# Patient Record
Sex: Female | Born: 1990 | Race: Black or African American | Hispanic: No | Marital: Single | Smoking: Never smoker
Health system: Southern US, Community
[De-identification: ages and names within clinical notes are randomized; demographics above are authoritative.]

---

## 2017-05-04 ENCOUNTER — Ambulatory Visit (INDEPENDENT_AMBULATORY_CARE_PROVIDER_SITE_OTHER): Payer: Commercial Managed Care - PPO | Admitting: Maternal Newborn

## 2017-05-04 ENCOUNTER — Encounter: Payer: Self-pay | Admitting: Maternal Newborn

## 2017-05-04 VITALS — BP 134/100 | HR 84 | Ht 63.0 in | Wt 235.0 lb

## 2017-05-04 DIAGNOSIS — Z3009 Encounter for other general counseling and advice on contraception: Secondary | ICD-10-CM

## 2017-05-04 DIAGNOSIS — Z124 Encounter for screening for malignant neoplasm of cervix: Secondary | ICD-10-CM

## 2017-05-04 DIAGNOSIS — Z30011 Encounter for initial prescription of contraceptive pills: Secondary | ICD-10-CM

## 2017-05-04 DIAGNOSIS — Z01419 Encounter for gynecological examination (general) (routine) without abnormal findings: Secondary | ICD-10-CM

## 2017-05-04 MED ORDER — NORETHINDRONE 0.35 MG PO TABS: 1.0000 | ORAL_TABLET | Freq: Every day | ORAL | 11 refills | Status: AC

## 2017-05-04 NOTE — Progress Notes (Signed)
Gynecology Annual Exam  PCP: Patient, No Pcp Per  Chief Complaint:  Chief Complaint  Patient presents with  . Gynecologic Exam    bc refill    History of Present Illness: Patient is a 27 y.o. No obstetric history on file, presenting for annual exam. The patient has no complaints today.   LMP: Patient's last menstrual period was 02/03/2017. Menarche: 15 Average Interval: regular, 28 days Duration of flow: several days Heavy Menses: no Clots: no Intermenstrual Bleeding: no Postcoital Bleeding: no Dysmenorrhea: no  The patient is sexually active. She currently uses OCP (estrogen/progesterone) for contraception. She denies dyspareunia.  The patient does perform self breast exams.  There is no notable family history of breast or ovarian cancer in her family.  The patient wears seatbelts: yes.  The patient has regular exercise: no.    The patient denies current symptoms of depression.    Review of Systems  Constitutional: Negative for malaise/fatigue and weight loss.  HENT: Negative.   Eyes: Negative for blurred vision and pain.  Respiratory: Negative for cough and wheezing.   Cardiovascular: Negative for chest pain and palpitations.  Gastrointestinal: Negative for constipation, diarrhea, heartburn and nausea.  Genitourinary: Negative.   Musculoskeletal: Negative.   Skin: Negative.   Neurological: Negative.   Endo/Heme/Allergies: Negative.   Psychiatric/Behavioral: Negative for depression. The patient is not nervous/anxious.   All other systems reviewed and are negative.   Past Medical History:  History reviewed. No pertinent past medical history.  Past Surgical History:  History reviewed. No pertinent surgical history.  Gynecologic History:  Patient's last menstrual period was 02/03/2017. Contraception: OCP (estrogen/progesterone) Last Pap: has had a normal Pap but date unknown  Obstetric History: No obstetric history on file.  Family History:  Family  History  Problem Relation Age of Onset  . Diabetes Father   . Hypertension Father   . Cancer Maternal Aunt 40       colon  . Cancer Maternal Grandfather        colon  . Diabetes Paternal Grandmother   . Diabetes Paternal Grandfather   . Hypertension Paternal Grandfather   . Heart failure Maternal Aunt     Social History:  Social History   Socioeconomic History  . Marital status: Single    Spouse name: Not on file  . Number of children: Not on file  . Years of education: Not on file  . Highest education level: Not on file  Social Needs  . Financial resource strain: Not on file  . Food insecurity - worry: Not on file  . Food insecurity - inability: Not on file  . Transportation needs - medical: Not on file  . Transportation needs - non-medical: Not on file  Occupational History  . Not on file  Tobacco Use  . Smoking status: Never Smoker  . Smokeless tobacco: Never Used  Substance and Sexual Activity  . Alcohol use: Yes    Frequency: Never    Comment: occ  . Drug use: No  . Sexual activity: Yes    Birth control/protection: Condom  Other Topics Concern  . Not on file  Social History Narrative  . Not on file    Allergies:  No Known Allergies  Medications: Prior to Admission medications   Medication Sig Start Date End Date Taking? Authorizing Provider  norgestimate-ethinyl estradiol (SPRINTEC 28) 0.25-35 MG-MCG tablet Take 1 tablet by mouth daily.   Yes [provider]    Physical Exam Vitals: Blood pressure (!) 150/94,  pulse 84, height 5\' 3"  (1.6 m), weight 235 lb (106.6 kg), last menstrual period 02/03/2017.  General: NAD HEENT: normocephalic, anicteric Thyroid: no enlargement, no palpable nodules Pulmonary: No increased work of breathing, CTAB Cardiovascular: RRR, S1 and S2 auscultated, no murmurs, rubs or gallops Breast: Breasts symmetrical, no tenderness, no palpable nodules or masses, no skin or nipple retraction present, no nipple discharge.   No axillary or supraclavicular lymphadenopathy. Abdomen: soft, non-tender, non-distended.  Umbilicus without lesions.  No hepatomegaly, splenomegaly or masses palpable. No evidence of hernia  Genitourinary:  External: Normal external female genitalia.  Normal urethral  meatus, normal Bartholin's and Skene's glands.    Vagina: Normal vaginal mucosa, no evidence of prolapse.    Cervix: Grossly normal in appearance, no bleeding  Uterus: Non-enlarged, mobile, normal contour.  No CMT  Adnexa: ovaries non-enlarged, no adnexal masses  Rectal: deferred  Lymphatic: no evidence of inguinal lymphadenopathy Extremities: no edema, erythema, or tenderness Neurologic: Grossly intact Psychiatric: mood appropriate, affect full  Assessment: 27 y.o. No obstetric history on file, routine annual exam.  Plan: Problem List Items Addressed This Visit    None    Visit Diagnoses    Encounter for annual routine gynecological examination    -  Primary   Pap smear for cervical cancer screening       Relevant Orders   Pap IG w/ reflex to HPV when ASC-U   Oral contraceptive prescribed       Relevant Medications   norethindrone (MICRONOR,CAMILA,ERRIN) 0.35 MG tablet   Encounter for counseling regarding initiation of other contraceptive measure          1) Repeat BP in clinic 134/100. Patient is asymptomatic. Encouraged her to screen for elevated blood pressure over the next few weeks at home or at a pharmacy and seek care from a PCP for antihypertensive therapy if readings remain high after stopping OCPs.  2) STI screening was offered and declined.  3) ASCCP guidelines and rationale discussed.  Patient opts for every 3 year screening interval.  4) Contraception - Discontinuing combination OCPs today due to two elevated BP readings at this visit, requiring cessation according to the [facility] guidelines for contraceptive therapy. Education given regarding options for contraception, including injectable contraception,  IUD placement, Nexplanon.  Patient does not desire a LARC or DepoProvera at this time. She will try Micronor. Explained side effects and emphasized need for heightened level of accuracy with POPs and to take them at the same time daily.  5) Follow up 1 year for routine annual exam.  Marcelyn Bruins, CNM 05/04/2017  11:58 AM

## 2017-05-07 LAB — PAP IG W/ RFLX HPV ASCU: PAP Smear Comment: 0

## 2017-05-25 ENCOUNTER — Encounter: Payer: Self-pay | Admitting: Maternal Newborn

## 2021-04-28 ENCOUNTER — Other Ambulatory Visit: Payer: Self-pay | Admitting: Certified Nurse Midwife

## 2021-04-28 DIAGNOSIS — O200 Threatened abortion: Secondary | ICD-10-CM

## 2021-04-29 ENCOUNTER — Ambulatory Visit: Payer: BC Managed Care – PPO

## 2021-04-29 ENCOUNTER — Ambulatory Visit: Payer: Self-pay

## 2021-04-30 ENCOUNTER — Ambulatory Visit
Admission: RE | Admit: 2021-04-30 | Discharge: 2021-04-30 | Disposition: A | Discharge status: Home / Self Care | Payer: BC Managed Care – PPO | Source: Ambulatory Visit | Attending: Certified Nurse Midwife | Admitting: Certified Nurse Midwife

## 2021-04-30 DIAGNOSIS — O200 Threatened abortion: Secondary | ICD-10-CM | POA: Insufficient documentation

## 2023-02-18 IMAGING — US US OB < 14 WEEKS - US OB TV
1 series · 15 of 28 positions shown · non-contrast
Comparison: None.

CLINICAL DATA: Abortion

EXAM:
OBSTETRIC <14 WK US AND TRANSVAGINAL OB US
TECHNIQUE: Both transabdominal and transvaginal ultrasound examinations were
performed for complete evaluation of the gestation as well as the
maternal uterus, adnexal regions, and pelvic cul-de-sac.
Transvaginal technique was performed to assess early pregnancy.

[Series 1: us ob comp less 14 wks · 15 of 119 slices shown]
[im 1/119]
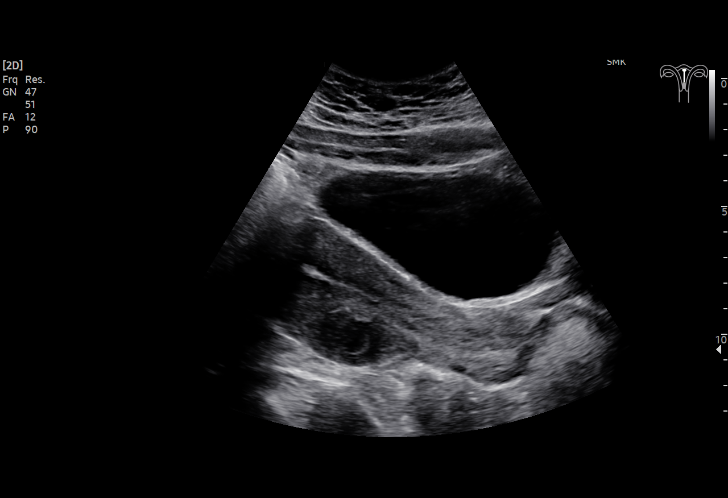
[im 9/119]
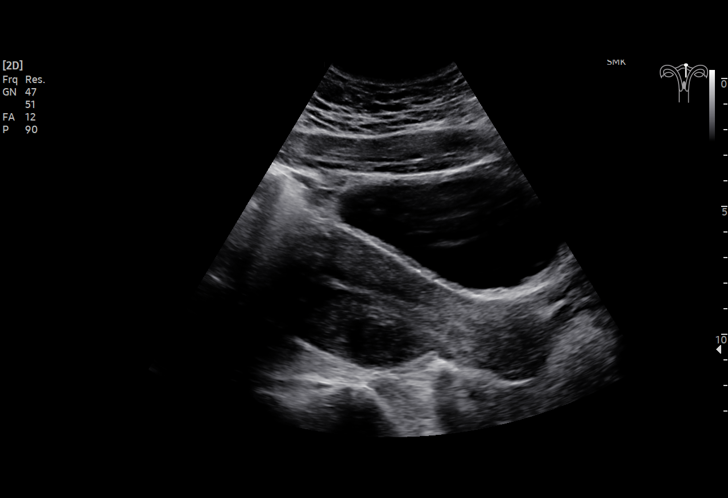
[im 18/119]
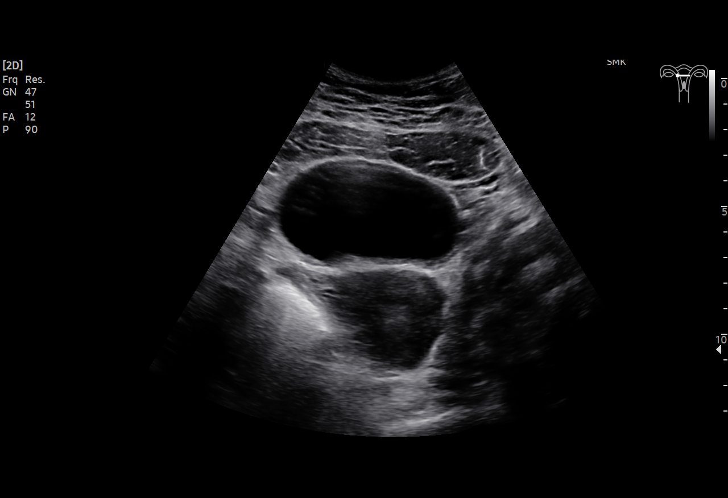
[im 27/119]
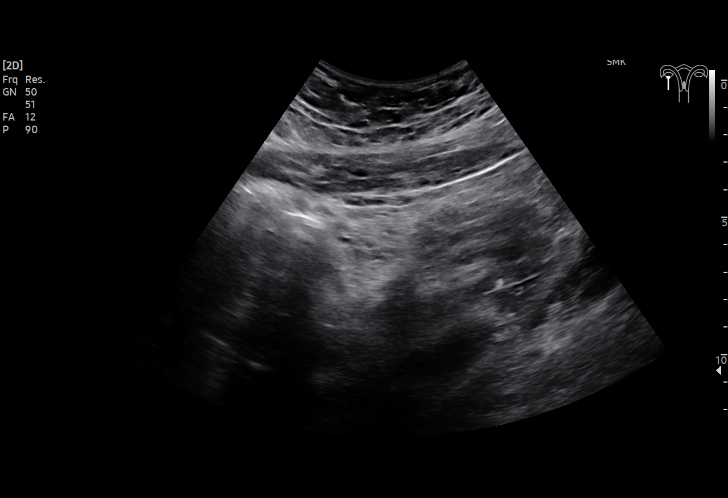
[im 35/119]
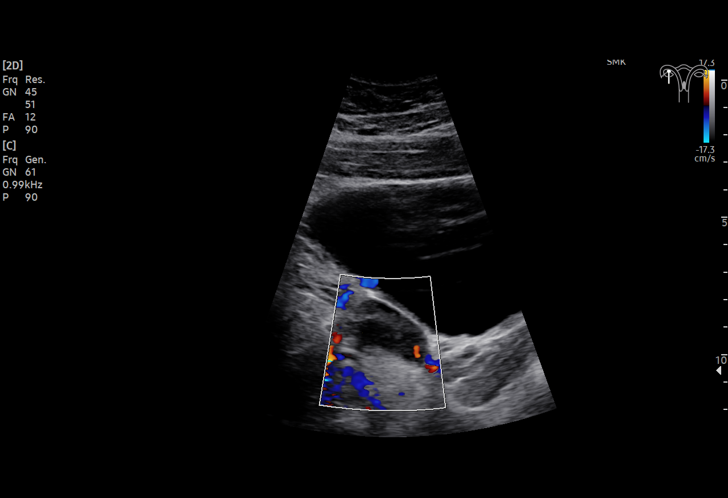
[im 44/119]
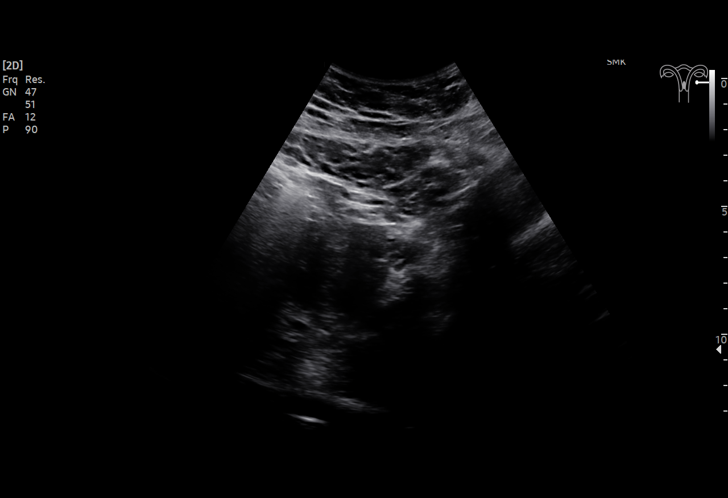
[im 53/119]
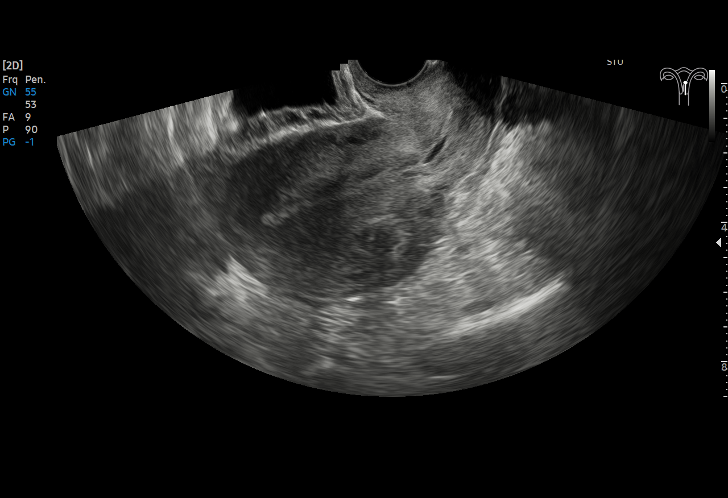
[im 62/119]
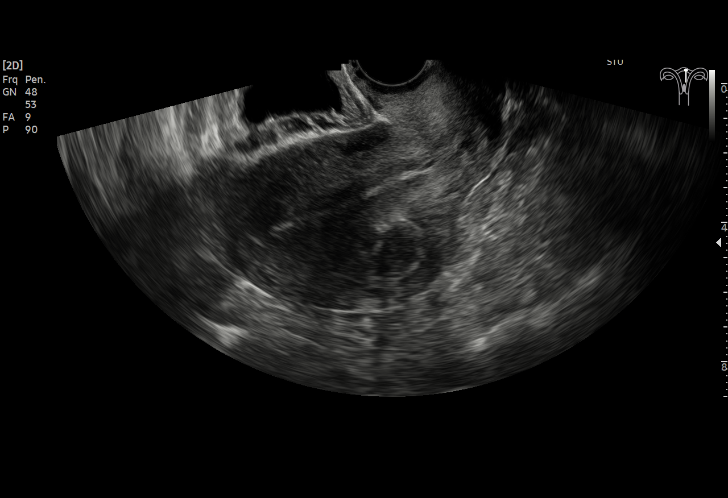
[im 66/119]
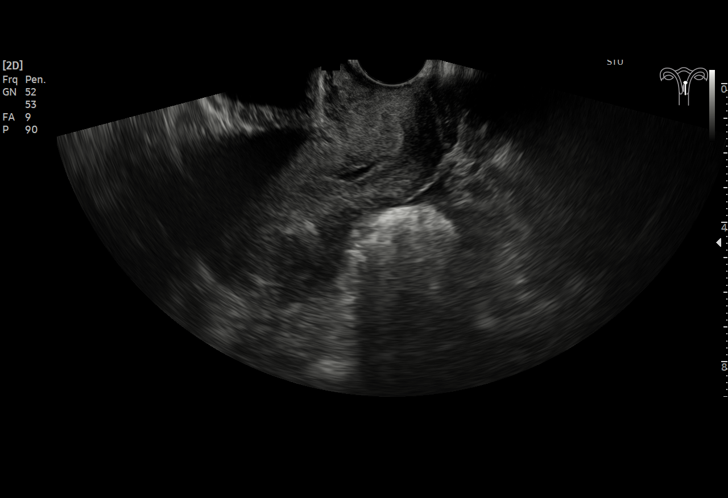
[im 75/119]
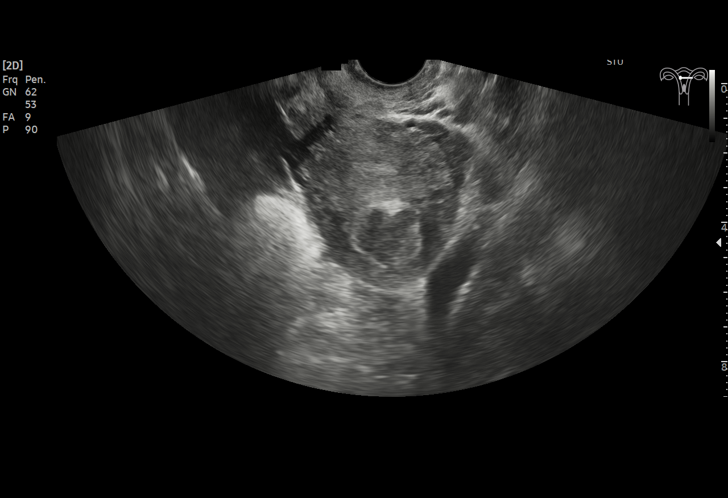
[im 84/119]
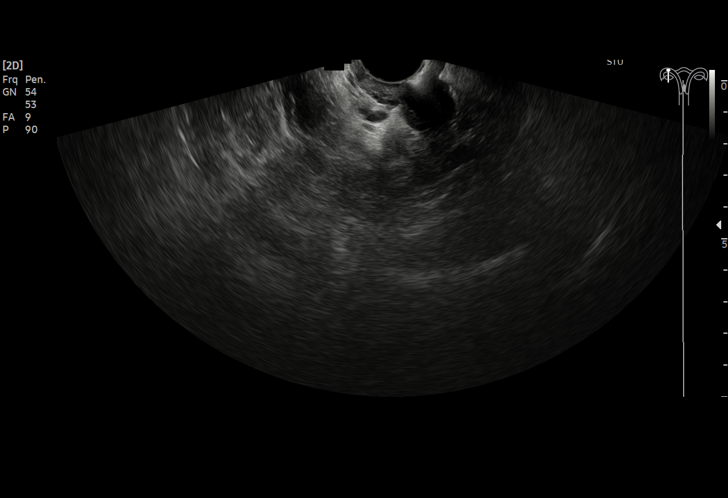
[im 92/119]
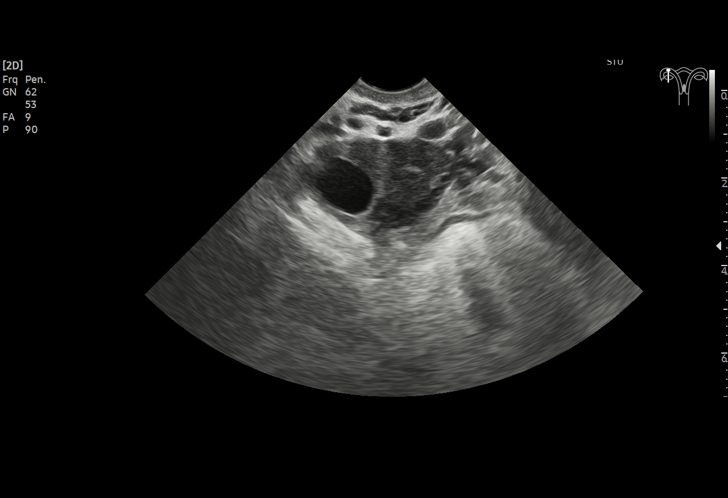
[im 101/119]
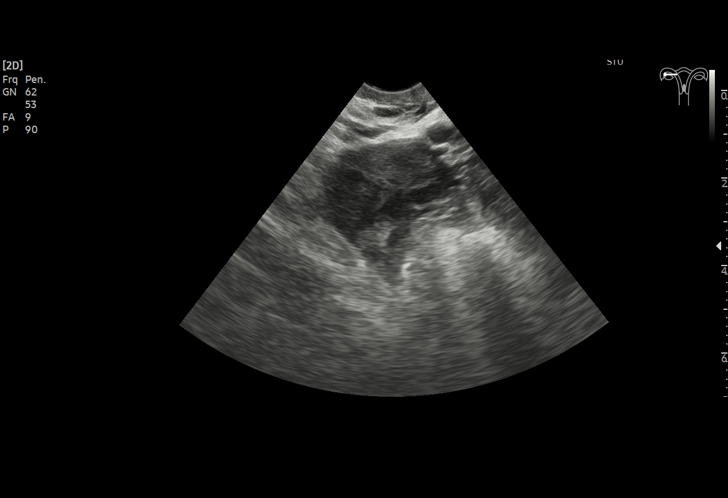
[im 110/119]
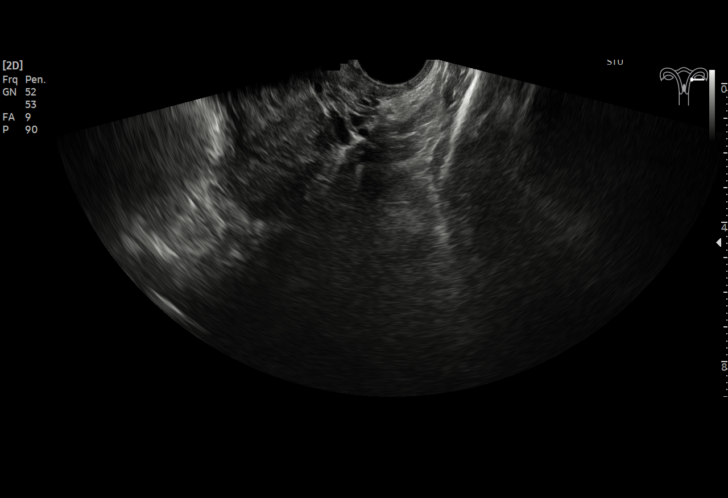
[im 119/119]
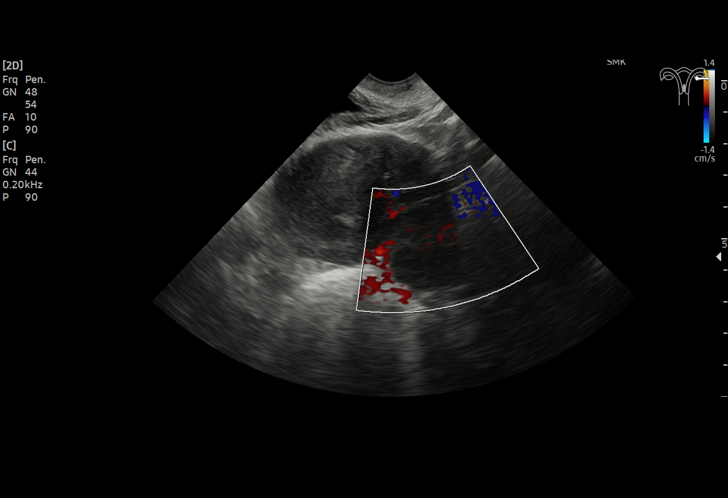

[15 of 28 positions shown; findings below may reference images not displayed]

FINDINGS: Intrauterine gestational sac: None

Yolk sac:  Not Visualized.

Embryo:  Not Visualized.

Cardiac Activity: Not Visualized.

Heart Rate: Not applicable

MSD: Not applicable

CRL:  Not applicable

Subchorionic hemorrhage:  Not applicable

Maternal uterus/adnexae: Normal ovaries. There is a dominant right
ovarian follicle. Small amount of fluid within the cervical canal.
There is a heterogeneous posterior intramural uterine fibroid
measuring 3.3 x 2.4 x 3.6 cm.
IMPRESSION: No intrauterine gestational sac visualized. Small amount of fluid
within the cervical canal. Findings are consistent with reported
recent abortion. Recommend close clinical follow-up with OBGYN.
